# Patient Record
Sex: Male | Born: 1980 | Race: Asian | Hispanic: No | Marital: Married | Smoking: Former smoker
Health system: Southern US, Community
[De-identification: ages and names within clinical notes are randomized; demographics above are authoritative.]

## PROBLEM LIST (undated history)

## (undated) DIAGNOSIS — K219 Gastro-esophageal reflux disease without esophagitis: Secondary | ICD-10-CM

---

## 2007-12-29 ENCOUNTER — Emergency Department (HOSPITAL_COMMUNITY): Admission: EM | Admit: 2007-12-29 | Discharge: 2007-12-29 | Payer: Self-pay | Admitting: Emergency Medicine

## 2008-01-10 ENCOUNTER — Emergency Department (HOSPITAL_COMMUNITY): Admission: EM | Admit: 2008-01-10 | Discharge: 2008-01-10 | Payer: Self-pay | Admitting: Family Medicine

## 2008-01-25 ENCOUNTER — Ambulatory Visit: Payer: Self-pay | Admitting: Gastroenterology

## 2008-01-26 ENCOUNTER — Ambulatory Visit: Payer: Self-pay | Admitting: Gastroenterology

## 2008-01-26 ENCOUNTER — Encounter: Payer: Self-pay | Admitting: Gastroenterology

## 2008-02-03 ENCOUNTER — Ambulatory Visit (HOSPITAL_BASED_OUTPATIENT_CLINIC_OR_DEPARTMENT_OTHER): Admission: RE | Admit: 2008-02-03 | Discharge: 2008-02-03 | Payer: Self-pay | Admitting: Otolaryngology

## 2008-02-03 ENCOUNTER — Encounter (INDEPENDENT_AMBULATORY_CARE_PROVIDER_SITE_OTHER): Payer: Self-pay | Admitting: Otolaryngology

## 2008-02-06 ENCOUNTER — Emergency Department (HOSPITAL_COMMUNITY): Admission: EM | Admit: 2008-02-06 | Discharge: 2008-02-06 | Payer: Self-pay | Admitting: Emergency Medicine

## 2008-02-08 ENCOUNTER — Emergency Department (HOSPITAL_COMMUNITY): Admission: EM | Admit: 2008-02-08 | Discharge: 2008-02-08 | Payer: Self-pay | Admitting: Emergency Medicine

## 2008-02-25 ENCOUNTER — Emergency Department (HOSPITAL_COMMUNITY): Admission: EM | Admit: 2008-02-25 | Discharge: 2008-02-25 | Payer: Self-pay | Admitting: Emergency Medicine

## 2008-03-09 ENCOUNTER — Ambulatory Visit: Payer: Self-pay | Admitting: Gastroenterology

## 2008-03-15 ENCOUNTER — Encounter: Admission: RE | Admit: 2008-03-15 | Discharge: 2008-04-29 | Payer: Self-pay | Admitting: Family Medicine

## 2008-04-28 ENCOUNTER — Ambulatory Visit: Payer: Self-pay | Admitting: Internal Medicine

## 2008-04-28 DIAGNOSIS — IMO0001 Reserved for inherently not codable concepts without codable children: Secondary | ICD-10-CM | POA: Insufficient documentation

## 2008-04-28 DIAGNOSIS — J309 Allergic rhinitis, unspecified: Secondary | ICD-10-CM | POA: Insufficient documentation

## 2008-04-29 ENCOUNTER — Encounter (INDEPENDENT_AMBULATORY_CARE_PROVIDER_SITE_OTHER): Payer: Self-pay | Admitting: Internal Medicine

## 2008-05-03 DIAGNOSIS — E559 Vitamin D deficiency, unspecified: Secondary | ICD-10-CM | POA: Insufficient documentation

## 2008-05-05 ENCOUNTER — Encounter (INDEPENDENT_AMBULATORY_CARE_PROVIDER_SITE_OTHER): Payer: Self-pay | Admitting: Internal Medicine

## 2008-06-02 ENCOUNTER — Encounter (INDEPENDENT_AMBULATORY_CARE_PROVIDER_SITE_OTHER): Payer: Self-pay | Admitting: *Deleted

## 2008-06-03 ENCOUNTER — Encounter (INDEPENDENT_AMBULATORY_CARE_PROVIDER_SITE_OTHER): Payer: Self-pay | Admitting: Internal Medicine

## 2008-06-08 ENCOUNTER — Encounter (INDEPENDENT_AMBULATORY_CARE_PROVIDER_SITE_OTHER): Payer: Self-pay | Admitting: Internal Medicine

## 2008-06-13 ENCOUNTER — Encounter: Admission: RE | Admit: 2008-06-13 | Discharge: 2008-06-13 | Payer: Self-pay | Admitting: Family Medicine

## 2008-06-13 ENCOUNTER — Encounter (INDEPENDENT_AMBULATORY_CARE_PROVIDER_SITE_OTHER): Payer: Self-pay | Admitting: Internal Medicine

## 2008-07-27 ENCOUNTER — Ambulatory Visit: Payer: Self-pay | Admitting: Internal Medicine

## 2008-07-27 ENCOUNTER — Telehealth (INDEPENDENT_AMBULATORY_CARE_PROVIDER_SITE_OTHER): Payer: Self-pay | Admitting: Internal Medicine

## 2008-07-29 LAB — CONVERTED CEMR LAB
ALT: 48 units/L (ref 0–53)
BUN: 9 mg/dL (ref 6–23)
CO2: 22 meq/L (ref 19–32)
Calcium: 9.6 mg/dL (ref 8.4–10.5)
Chloride: 103 meq/L (ref 96–112)
Creatinine, Ser: 0.85 mg/dL (ref 0.40–1.50)
Glucose, Bld: 137 mg/dL — ABNORMAL HIGH (ref 70–99)
Total Bilirubin: 0.4 mg/dL (ref 0.3–1.2)
Vit D, 1,25-Dihydroxy: 28 — ABNORMAL LOW (ref 30–89)

## 2008-09-13 ENCOUNTER — Ambulatory Visit: Payer: Self-pay | Admitting: Internal Medicine

## 2008-09-13 DIAGNOSIS — Z8639 Personal history of other endocrine, nutritional and metabolic disease: Secondary | ICD-10-CM

## 2008-09-13 DIAGNOSIS — K219 Gastro-esophageal reflux disease without esophagitis: Secondary | ICD-10-CM

## 2008-09-13 DIAGNOSIS — Z862 Personal history of diseases of the blood and blood-forming organs and certain disorders involving the immune mechanism: Secondary | ICD-10-CM

## 2008-09-15 ENCOUNTER — Telehealth (INDEPENDENT_AMBULATORY_CARE_PROVIDER_SITE_OTHER): Payer: Self-pay | Admitting: Internal Medicine

## 2008-09-20 ENCOUNTER — Encounter (INDEPENDENT_AMBULATORY_CARE_PROVIDER_SITE_OTHER): Payer: Self-pay | Admitting: Internal Medicine

## 2008-09-22 ENCOUNTER — Encounter (INDEPENDENT_AMBULATORY_CARE_PROVIDER_SITE_OTHER): Payer: Self-pay | Admitting: Internal Medicine

## 2008-09-22 LAB — CONVERTED CEMR LAB
ALT: 44 units/L (ref 0–53)
AST: 31 units/L (ref 0–37)
Albumin: 4.9 g/dL (ref 3.5–5.2)
Alkaline Phosphatase: 68 units/L (ref 39–117)
HCV Ab: NEGATIVE
Hep A Total Ab: POSITIVE — AB
Hep B Core Total Ab: NEGATIVE
Hep B S Ab: POSITIVE — AB
Total Bilirubin: 0.4 mg/dL (ref 0.3–1.2)

## 2008-09-29 ENCOUNTER — Encounter (INDEPENDENT_AMBULATORY_CARE_PROVIDER_SITE_OTHER): Payer: Self-pay | Admitting: *Deleted

## 2008-09-29 ENCOUNTER — Telehealth (INDEPENDENT_AMBULATORY_CARE_PROVIDER_SITE_OTHER): Payer: Self-pay | Admitting: Internal Medicine

## 2008-10-07 ENCOUNTER — Ambulatory Visit: Payer: Self-pay | Admitting: Internal Medicine

## 2008-10-10 ENCOUNTER — Ambulatory Visit: Payer: Self-pay | Admitting: Nurse Practitioner

## 2008-10-10 ENCOUNTER — Telehealth (INDEPENDENT_AMBULATORY_CARE_PROVIDER_SITE_OTHER): Payer: Self-pay | Admitting: Nurse Practitioner

## 2008-10-10 DIAGNOSIS — H6690 Otitis media, unspecified, unspecified ear: Secondary | ICD-10-CM | POA: Insufficient documentation

## 2008-10-20 ENCOUNTER — Encounter (INDEPENDENT_AMBULATORY_CARE_PROVIDER_SITE_OTHER): Payer: Self-pay | Admitting: *Deleted

## 2008-10-20 LAB — CONVERTED CEMR LAB: Phosphorus: 3.7 mg/dL (ref 2.3–4.6)

## 2008-10-23 ENCOUNTER — Telehealth (INDEPENDENT_AMBULATORY_CARE_PROVIDER_SITE_OTHER): Payer: Self-pay | Admitting: Internal Medicine

## 2008-11-04 ENCOUNTER — Telehealth (INDEPENDENT_AMBULATORY_CARE_PROVIDER_SITE_OTHER): Payer: Self-pay | Admitting: Internal Medicine

## 2008-12-09 ENCOUNTER — Ambulatory Visit: Payer: Self-pay | Admitting: Internal Medicine

## 2008-12-09 DIAGNOSIS — H6980 Other specified disorders of Eustachian tube, unspecified ear: Secondary | ICD-10-CM | POA: Insufficient documentation

## 2009-06-20 IMAGING — CR DG CHEST 1V
1 series · 1 of 1 positions shown · non-contrast
Comparison: NONE

CLINICAL DATA: Positive PPD. 

CHEST - SINGLE VIEW (PA)

[view not recorded]
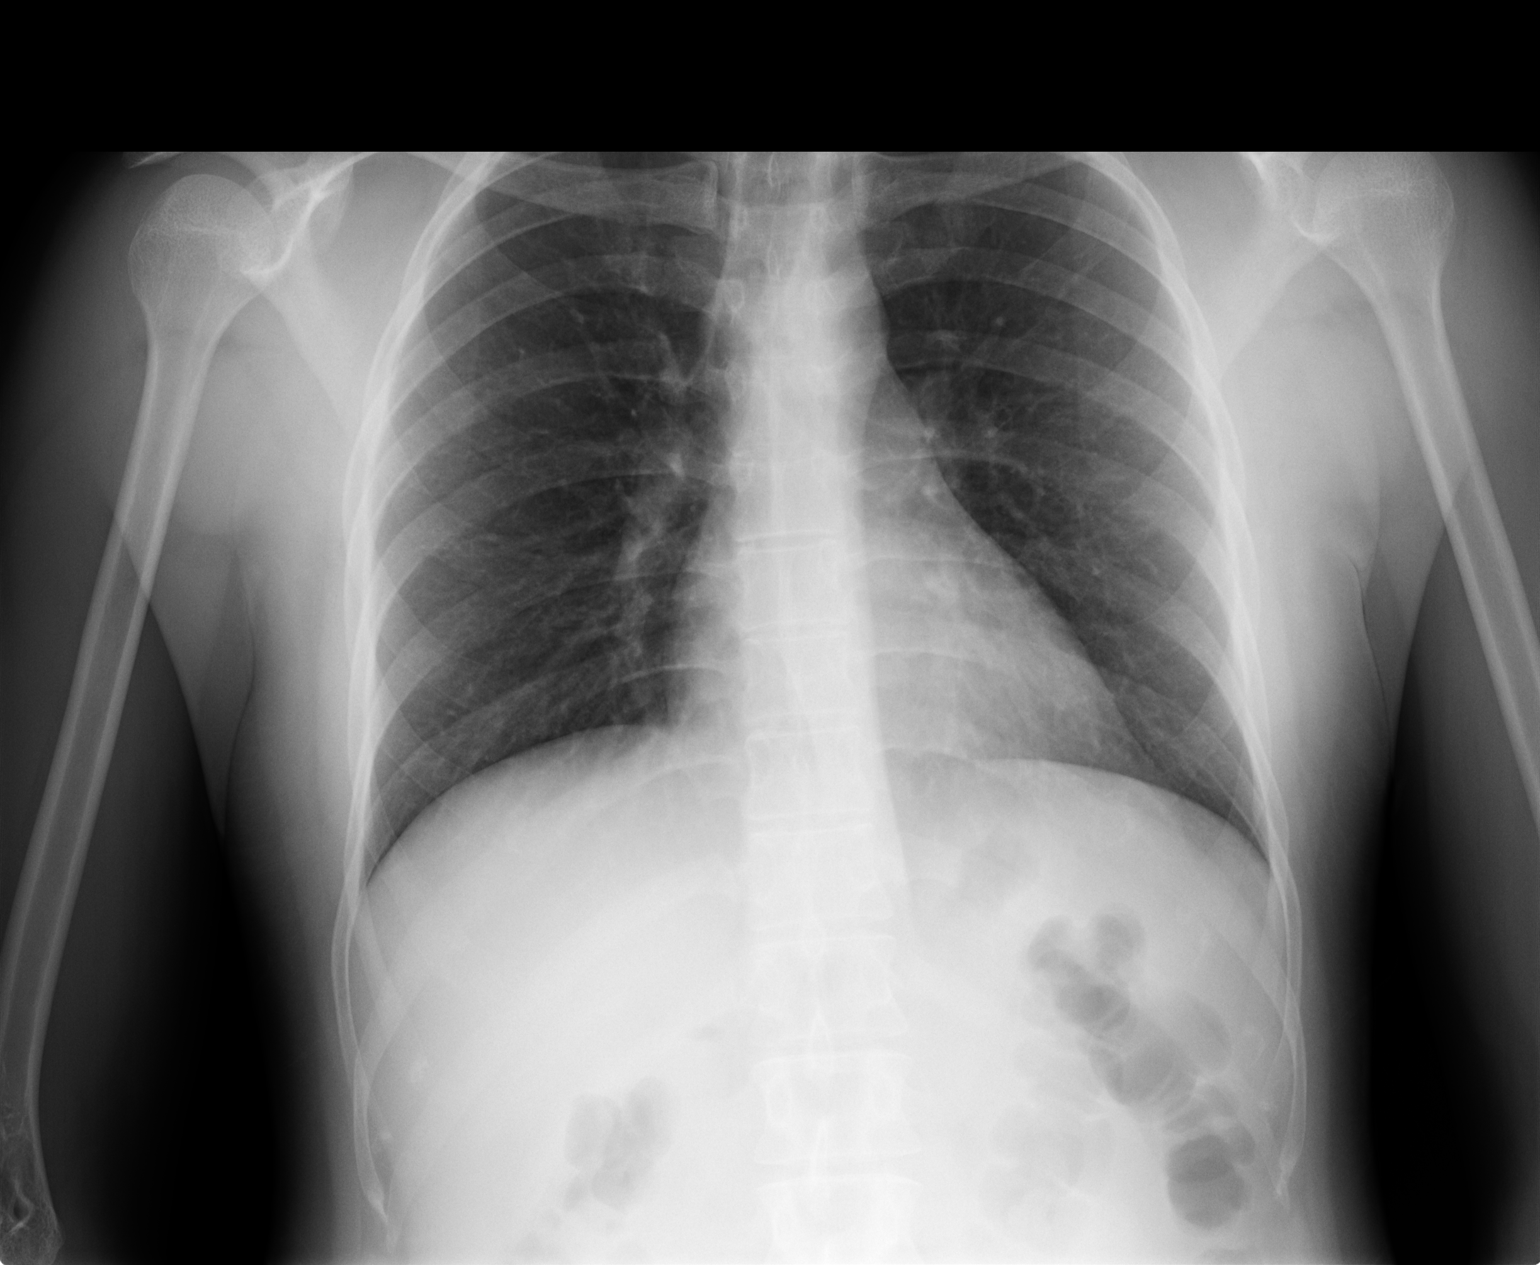

[1 of 1 positions shown; findings below may reference images not displayed]

FINDINGS: The lungs are clear and well expanded.   Heart and 
pulmonary vessels are normal.  No significant abnormalities are 
noted in the regional skeleton.  No evidence of thoracic aortic 
aneurysm or dissection.
IMPRESSION: No active disease. No evidence of active or remote 
05/10/2008 Dict Date: 05/10/2008  Trans Date: 05/10/2008 [REDACTED]  [REDACTED]

## 2011-05-14 NOTE — Op Note (Signed)
Erik Douglas, Erik Douglas                ACCOUNT NO.:  [id number]   MEDICAL RECORD NO.:  [id number]          PATIENT TYPE:  AMB   LOCATION:  DSC                          FACILITY:  [facility]   PHYSICIAN:  Karol T. Lazarus Salines, M.D. DATE OF BIRTH:  [birth year]   DATE OF PROCEDURE:  02/03/2008  DATE OF DISCHARGE:                               OPERATIVE REPORT   PREOPERATIVE DIAGNOSIS:  Recurrent tonsillitis.   POSTOPERATIVE DIAGNOSIS:  Recurrent tonsillitis.   PROCEDURE PERFORMED:  Tonsillectomy.   SURGEON:  Gloris Manchester. Wolicki, MD.   ANESTHESIA:  General orotracheal.   ESTIMATED BLOOD LOSS:  Minimal.   COMPLICATIONS:  None.   FINDINGS:  2+, embedded, fibrotic tonsils with copious, cryptic debris.  No residual adenoids.  Normal soft palate.   PROCEDURE:  With the patient in a comfortable, supine position, a  general orotracheal anesthesia was induced without difficulty.  At an  appropriate level, the table was turned 90 degrees and the patient  placed in Trendelenburg.  A clean preparation and draping was  accomplished.  Taking care to protect lips, teeth, and endotracheal  tube, the Crowe-Davis mouth gag was introduced, expanded for  visualization, and suspended from the Mayo stand in the standard  fashion.  The findings were as described above.  A palate retractor and  mirror were used to visualize the nasopharynx with the findings as  described above.  The anterior nose was examined with a speculum with no  significant findings.  One-half percent Xylocaine with 1:200,000  epinephrine, 8 ml total was infiltrated into the peritonsillar planes  for intraoperative hemostasis.  Several minutes were allowed for this to  take effect.   Beginning on the left side, the tonsil was grasped and retracted  medially.  The mucosa overlying the anterior and superior poles was  coagulated and then cut down to the capsule of the tonsil.  Using the  cautery tip as a blunt dissector, lysing fibrous bands,  and coagulating  crossing vessels as identified, the tonsil was removed from its muscular  fossa from inferiorly upward.  The tonsil was removed in its entirety as  determined by examination of both the tonsil and fossa.  A small,  additional quantity of cautery rendered the fossa hemostatic.  This  completed the left side.  The right side was done in an identical  fashion with slightly more fibrosis noted.  After completing the right  side and rendering both sides hemostatic, the mouth gag was relaxed for  several minutes.  Upon reexpansion, hemostasis was persistent.  At this  point, the procedure was completed.  The mouth gag was relaxed and  removed.  The dental status was intact.  The patient was returned to  Anesthesia, awakened, extubated, and transferred to recovery in stable  condition.   COMMENT:  A 30 year old, [place]alese, immigrant male has been having  multiple, recurrent episodes of tonsillitis, hence, the indication for  today's procedure.  Anticipated routine postoperative recovery with  attention to analgesia, antibiosis, hydration, and observation for  bleeding, emesis, or airway compromise.  Will observe him 23 hours  extended recovery,  home sooner if he is doing nicely.      Gloris Manchester. Lazarus Salines, M.D.  Electronically Signed     KTW/MEDQ  D:  02/03/2008  T:  02/03/2008  Job:  [id number]

## 2011-05-14 NOTE — Assessment & Plan Note (Signed)
Erik Douglas                         GASTROENTEROLOGY OFFICE NOTE   Erik Douglas, Erik Douglas                         MRN:          [id number]  DATE:01/25/2008                            DOB:          Oct 11, 1981    REASON FOR CONSULTATION:  Abdominal pain.   Erik Douglas is a 30 year old male referred for evaluation of above.  For  the past 3 years he has been complaining of burning upper abdominal  pain.  Pain is worsened on an empty stomach.  He also has post prandial  nausea and complains of pyrosis.  He took omeprazole for 2 weeks with  improvement of his pyrosis, though the pain continued.  He is on no  gastric irritants including nonsteroidals.  He denies dysphagia or  odynophagia.  He also has occasional early satiety.   PAST MEDICAL HISTORY:  Unremarkable.   FAMILY HISTORY:  Noncontributory.   He is on no medications and HAS NO ALLERGIES.   He very rarely smokes, he does not drink.  He is married and unemployed.   REVIEW OF SYSTEMS:  Positive for occasional joint pain.   PHYSICAL EXAMINATION:  Pulse 76, blood pressure 100/60, weight 130.  HEENT: EOMI.  PERRLA.  Sclerae are anicteric.  Conjunctivae are pink.  NECK:  Supple without thyromegaly, adenopathy or carotid bruits.  CHEST:  Clear to auscultation and percussion without adventitious  sounds.  CARDIAC:  Regular rhythm; normal S1 S2.  There are no murmurs, gallops  or rubs.  ABDOMEN:  Bowel sounds are normoactive.  Abdomen is soft, nontender and  nondistended.  There are no abdominal masses, tenderness, splenic  enlargement or hepatomegaly.  EXTREMITIES:  Full range of motion.  No cyanosis, clubbing or edema.  RECTAL:  Deferred.   IMPRESSION:  1. Nonspecific dyspepsia.  He recently immigrated from [place].  H.      pylori is a concern.  2. Gastroesophageal reflux disease.   RECOMMENDATION:  1. Upper endoscopy.  2. I would consider anticholinergics and PPI therapy pending results      of  the endoscopy.     Barbette Hair. Arlyce Dice, MD,FACG  Electronically Signed    Erik Douglas  DD: 01/25/2008  DT: 01/25/2008  Job #: [id number]   cc:   Erik Douglas, M.D.

## 2011-05-14 NOTE — Letter (Signed)
January 25, 2008    Despina Arias   RE:  MIQUAN, TANDON  MRN:  [id number]  /  DOB:  [birth year]   Dear Lattie Haw:   It is my pleasure to have treated you recently as a new patient in my  office.  I appreciate your confidence and the opportunity to participate  in your care.   Since I do have a busy inpatient endoscopy schedule and office schedule,  my office hours vary weekly.  I am, however, available for emergency  calls every day through my office.  If I cannot promptly meet an urgent  office appointment, another one of our gastroenterologists will be able  to assist you.   My well-trained staff are prepared to help you at all times.  For  emergencies after office hours, a physician from our gastroenterology  section is always available through my 24-hour answering service.   While you are under my care, I encourage discussion of your questions  and concerns, and I will be happy to return your calls as soon as I am  available.   Once again, I welcome you as a new patient and I look forward to a happy  and healthy relationship.    Sincerely,      Barbette Hair. Arlyce Dice, MD,FACG  Electronically Signed   RDK/MedQ  DD: 01/25/2008  DT: 01/25/2008  Job #: [id number]

## 2011-09-19 LAB — POCT RAPID STREP A: Streptococcus, Group A Screen (Direct): NEGATIVE

## 2011-09-20 LAB — I-STAT 8, (EC8 V) (CONVERTED LAB)
Acid-Base Excess: 5 — ABNORMAL HIGH
Chloride: 100
HCT: 51
Hemoglobin: 17.3 — ABNORMAL HIGH
Operator id: 265201
Potassium: 3.5

## 2011-09-20 LAB — POCT I-STAT CREATININE: Operator id: 265201

## 2011-09-20 LAB — DIFFERENTIAL
Basophils Absolute: 0
Lymphocytes Relative: 35
Lymphs Abs: 4.3 — ABNORMAL HIGH
Neutro Abs: 6.6

## 2011-09-20 LAB — CBC
Hemoglobin: 15.1
Platelets: 333
RDW: 12.6
WBC: 12.3 — ABNORMAL HIGH

## 2011-09-20 LAB — POCT HEMOGLOBIN-HEMACUE: Hemoglobin: 15.6

## 2011-10-04 LAB — DIFFERENTIAL
Basophils Relative: 0
Eosinophils Absolute: 0.3
Monocytes Relative: 8
Neutrophils Relative %: 58

## 2011-10-04 LAB — HEPATIC FUNCTION PANEL
Albumin: 4.6
Total Bilirubin: 0.7
Total Protein: 8

## 2011-10-04 LAB — CBC
MCHC: 34
MCV: 89
Platelets: 247
RDW: 13.3

## 2012-02-15 ENCOUNTER — Emergency Department (HOSPITAL_COMMUNITY): Payer: PRIVATE HEALTH INSURANCE

## 2012-02-15 ENCOUNTER — Encounter (HOSPITAL_COMMUNITY): Payer: Self-pay | Admitting: *Deleted

## 2012-02-15 ENCOUNTER — Emergency Department (HOSPITAL_COMMUNITY)
Admission: EM | Admit: 2012-02-15 | Discharge: 2012-02-15 | Disposition: A | Discharge status: Home / Self Care | Payer: PRIVATE HEALTH INSURANCE | Attending: Emergency Medicine | Admitting: Emergency Medicine

## 2012-02-15 DIAGNOSIS — R10814 Left lower quadrant abdominal tenderness: Secondary | ICD-10-CM | POA: Insufficient documentation

## 2012-02-15 DIAGNOSIS — F17200 Nicotine dependence, unspecified, uncomplicated: Secondary | ICD-10-CM | POA: Insufficient documentation

## 2012-02-15 DIAGNOSIS — R319 Hematuria, unspecified: Secondary | ICD-10-CM | POA: Insufficient documentation

## 2012-02-15 DIAGNOSIS — Z79899 Other long term (current) drug therapy: Secondary | ICD-10-CM | POA: Insufficient documentation

## 2012-02-15 DIAGNOSIS — R1032 Left lower quadrant pain: Secondary | ICD-10-CM

## 2012-02-15 DIAGNOSIS — R112 Nausea with vomiting, unspecified: Secondary | ICD-10-CM | POA: Insufficient documentation

## 2012-02-15 DIAGNOSIS — Z841 Family history of disorders of kidney and ureter: Secondary | ICD-10-CM | POA: Insufficient documentation

## 2012-02-15 LAB — CBC
HCT: 43.7 % (ref 39.0–52.0)
Platelets: 225 10*3/uL (ref 150–400)
RDW: 12.4 % (ref 11.5–15.5)
WBC: 7.7 10*3/uL (ref 4.0–10.5)

## 2012-02-15 LAB — DIFFERENTIAL
Basophils Absolute: 0.1 10*3/uL (ref 0.0–0.1)
Lymphocytes Relative: 23 % (ref 12–46)
Monocytes Absolute: 0.5 10*3/uL (ref 0.1–1.0)
Neutro Abs: 5.3 10*3/uL (ref 1.7–7.7)
Neutrophils Relative %: 69 % (ref 43–77)

## 2012-02-15 LAB — COMPREHENSIVE METABOLIC PANEL
ALT: 66 U/L — ABNORMAL HIGH (ref 0–53)
AST: 41 U/L — ABNORMAL HIGH (ref 0–37)
CO2: 25 mEq/L (ref 19–32)
Chloride: 100 mEq/L (ref 96–112)
GFR calc non Af Amer: >90 mL/min (ref >90)
Potassium: 3.7 mEq/L (ref 3.5–5.1)
Sodium: 136 mEq/L (ref 135–145)
Total Bilirubin: 0.5 mg/dL (ref 0.3–1.2)

## 2012-02-15 LAB — URINALYSIS, ROUTINE W REFLEX MICROSCOPIC
Glucose, UA: NEGATIVE mg/dL
Leukocytes, UA: NEGATIVE
Protein, ur: NEGATIVE mg/dL

## 2012-02-15 MED ORDER — SODIUM CHLORIDE 0.9 % IV BOLUS (SEPSIS)
1000.0000 mL | Freq: Once | INTRAVENOUS | Status: AC
  Administered 2012-02-15: 1000 mL via INTRAVENOUS

## 2012-02-15 MED ORDER — ONDANSETRON HCL 4 MG/2ML IJ SOLN
4.0000 mg | Freq: Once | INTRAMUSCULAR | Status: AC
  Administered 2012-02-15: 4 mg via INTRAVENOUS
  Filled 2012-02-15: qty 2

## 2012-02-15 MED ORDER — PROMETHAZINE HCL 25 MG PO TABS: 25.0000 mg | ORAL_TABLET | Freq: Four times a day (QID) | ORAL | Status: AC | PRN

## 2012-02-15 MED ORDER — MORPHINE SULFATE 4 MG/ML IJ SOLN
4.0000 mg | Freq: Once | INTRAMUSCULAR | Status: DC
  Filled 2012-02-15: qty 1

## 2012-02-15 MED ORDER — KETOROLAC TROMETHAMINE 30 MG/ML IJ SOLN
INTRAMUSCULAR | Status: AC
  Administered 2012-02-15: 30 mg
  Filled 2012-02-15: qty 1

## 2012-02-15 MED ORDER — TRAMADOL HCL 50 MG PO TABS: 50.0000 mg | ORAL_TABLET | Freq: Four times a day (QID) | ORAL | Status: AC | PRN

## 2012-02-15 NOTE — ED Notes (Signed)
Pt refused to have Ultrasound done. sts his pain is better and due to insurance reasons he does not want a lot of testing done. MD aware

## 2012-02-15 NOTE — Discharge Instructions (Signed)
You may have a kidney stone. Return if your pain worsens to the emergency department  Ureteral Colic Ureteral colic is spasm-like pain from the kidney or the ureter. This is often caused by a kidney stone. The pain is caused by the stone trying to get through the tubes that pass your pee. HOME CARE   Drink enough fluids to keep your pee (urine) clear or pale yellow.   Strain all your pee. A strainer will be provided. Keep anything caught in the strainer and bring it to your doctor. The stone causing the pain may be very small.   Only take medicine as told by your doctor.   Follow up with your doctor as told.  GET HELP RIGHT AWAY IF:   Pain is not controlled with medicine.   Pain continues or gets worse.   The pain changes and there is chest or belly (abdominal) pain.   You pass out (faint).   You cannot pee.   You keep throwing up (vomiting).   You have a temperature by mouth above 102 F (38.9 C), not controlled by medicine.  MAKE SURE YOU:   Understand these instructions.   Will watch this condition.   Will get help right away if you are not doing well or get worse.  Document Released: 06/03/2008 Document Revised: 08/28/2011 Document Reviewed: 06/03/2008 [facility] Patient Information 2012 [place], [place].

## 2012-02-15 NOTE — ED Notes (Signed)
Reports LLQ pain that started at 0530, and having a "burning pain to his privates." denies difficulty urinating.

## 2012-02-15 NOTE — ED Provider Notes (Signed)
History     CSN: [id number]  Arrival date & time 02/15/12  0750   First MD Initiated Contact with Patient 02/15/12 0815      Chief Complaint  Patient presents with  . Abdominal Pain    (Consider location/radiation/quality/duration/timing/severity/associated sxs/prior treatment) HPI Pt p/w LLQ pain starting acutely at 0530 today radiating to groin. No urinary changes. + nausea and 1 episode of vomiting. No fever, chills, testicular pain, penile d/c. History of renal stones in family.  History reviewed. No pertinent past medical history.  History reviewed. No pertinent past surgical history.  History reviewed. No pertinent family history.  History  Substance Use Topics  . Smoking status: Current Everyday Smoker    Types: Cigarettes  . Smokeless tobacco: Not on file  . Alcohol Use: No      Review of Systems  Constitutional: Negative for fever and chills.  Respiratory: Negative for shortness of breath.   Cardiovascular: Negative for chest pain.  Gastrointestinal: Positive for nausea, vomiting and abdominal pain. Negative for diarrhea and constipation.  Genitourinary: Negative for dysuria, frequency, hematuria, flank pain, decreased urine volume, discharge, penile swelling, scrotal swelling, penile pain and testicular pain.  Musculoskeletal: Negative for back pain.  Skin: Negative for color change, pallor, rash and wound.    Allergies  Review of patient's allergies indicates no known allergies.  Home Medications   Current Outpatient Rx  Name Route Sig Dispense Refill  . RANITIDINE HCL 75 MG PO TABS Oral Take 75 mg by mouth daily as needed. Heartburn    . PROMETHAZINE HCL 25 MG PO TABS Oral Take 1 tablet (25 mg total) by mouth every 6 (six) hours as needed for nausea. 30 tablet 0  . TRAMADOL HCL 50 MG PO TABS Oral Take 1 tablet (50 mg total) by mouth every 6 (six) hours as needed for pain. 15 tablet 0    BP 131/92  Pulse 69  Temp(Src) 97.6 F (36.4 C) (Oral)  Resp  16  SpO2 100%  Physical Exam  Nursing note and vitals reviewed. Constitutional: He is oriented to person, place, and time. He appears well-developed and well-nourished. He appears distressed (mild distress, walking around room).  HENT:  Head: Normocephalic and atraumatic.  Mouth/Throat: Oropharynx is clear and moist.  Eyes: EOM are normal. Pupils are equal, round, and reactive to light.  Neck: Normal range of motion. Neck supple.  Cardiovascular: Normal rate and regular rhythm.   Pulmonary/Chest: Effort normal and breath sounds normal. No respiratory distress. He has no wheezes. He has no rales.  Abdominal: Soft. Bowel sounds are normal. There is tenderness. Hernia confirmed negative in the right inguinal area and confirmed negative in the left inguinal area.       Mild LLQ tenderness to palp  Genitourinary: Penis normal. Right testis shows no mass and no tenderness. Cremasteric reflex is not absent on the right side. Left testis shows no mass and no tenderness. Cremasteric reflex is not absent on the left side. No discharge found.  Musculoskeletal: Normal range of motion. He exhibits no edema and no tenderness.  Neurological: He is alert and oriented to person, place, and time.  Skin: Skin is warm and dry. No rash noted. No erythema.  Psychiatric: He has a normal mood and affect. His behavior is normal.    ED Course  Procedures (including critical care time)  Labs Reviewed  URINALYSIS, ROUTINE W REFLEX MICROSCOPIC - Abnormal; Notable for the following:    Hgb urine dipstick LARGE (*)  All other components within normal limits  COMPREHENSIVE METABOLIC PANEL - Abnormal; Notable for the following:    Glucose, Bld 104 (*)    AST 41 (*)    ALT 66 (*)    All other components within normal limits  CBC  DIFFERENTIAL  URINE MICROSCOPIC-ADD ON   No results found.   1. Hematuria   2. Abdominal pain, LLQ (left lower quadrant)   3. Family history of renal stone       MDM  Pt  states pain has improved dramatically and is refusing all imaging tests. He understands the risks involved and is competent to make decisions regarding his care. He was informed of the results and that he could be passing a renal stone. If his symptoms worsen he should present immediately to the ED for further evaluation        Loren Racer, MD 02/15/12 1026

## 2012-02-16 ENCOUNTER — Encounter (HOSPITAL_COMMUNITY): Payer: Self-pay | Admitting: *Deleted

## 2012-02-16 ENCOUNTER — Emergency Department (INDEPENDENT_AMBULATORY_CARE_PROVIDER_SITE_OTHER)
Admission: EM | Admit: 2012-02-16 | Discharge: 2012-02-16 | Disposition: A | Discharge status: Home / Self Care | Payer: PRIVATE HEALTH INSURANCE | Source: Home / Self Care

## 2012-02-16 DIAGNOSIS — R109 Unspecified abdominal pain: Secondary | ICD-10-CM

## 2012-02-16 DIAGNOSIS — R319 Hematuria, unspecified: Secondary | ICD-10-CM

## 2012-02-16 LAB — POCT URINALYSIS DIP (DEVICE)
Protein, ur: NEGATIVE mg/dL
Specific Gravity, Urine: <=1.005 (ref 1.005–1.030)
Urobilinogen, UA: 0.2 mg/dL (ref 0.0–1.0)
pH: 6 (ref 5.0–8.0)

## 2012-02-16 NOTE — ED Provider Notes (Signed)
History     CSN: [id number]  Arrival date & time 02/16/12  1323   None     Chief Complaint  Patient presents with  . Flank Pain    (Consider location/radiation/quality/duration/timing/severity/associated sxs/prior treatment) HPI Comments: Pt seen yesterday in ED for same, told might have kidney stone but declined imaging due to cost. Here because wants to know how big stone is.  Has same sx as yesterday, but as long as takes meds rx yesterday sx are well controlled.   Patient is a 31 y.o. male presenting with flank pain. The history is provided by the patient.  Flank Pain This is a new problem. The current episode started yesterday. The problem occurs constantly. The problem has not changed since onset.Associated symptoms include abdominal pain. Pertinent negatives include no chest pain. Associated symptoms comments: N/v. The symptoms are relieved by medications. The treatment provided moderate relief.    History reviewed. No pertinent past medical history.  History reviewed. No pertinent past surgical history.  History reviewed. No pertinent family history.  History  Substance Use Topics  . Smoking status: Current Everyday Smoker    Types: Cigarettes  . Smokeless tobacco: Not on file  . Alcohol Use: No      Review of Systems  Constitutional: Negative for fever and chills.  Cardiovascular: Negative for chest pain.  Gastrointestinal: Positive for nausea and abdominal pain. Negative for vomiting.  Genitourinary: Positive for flank pain. Negative for hematuria.       Burning sensation in genital area    Allergies  Review of patient's allergies indicates no known allergies.  Home Medications   Current Outpatient Rx  Name Route Sig Dispense Refill  . PROMETHAZINE HCL 25 MG PO TABS Oral Take 1 tablet (25 mg total) by mouth every 6 (six) hours as needed for nausea. 30 tablet 0  . RANITIDINE HCL 75 MG PO TABS Oral Take 75 mg by mouth daily as needed. Heartburn    .  TRAMADOL HCL 50 MG PO TABS Oral Take 1 tablet (50 mg total) by mouth every 6 (six) hours as needed for pain. 15 tablet 0    BP 112/80  Pulse 87  Temp(Src) 98.1 F (36.7 C) (Oral)  Resp 16  SpO2 99%  Physical Exam  Constitutional: He appears well-developed and well-nourished. No distress.  Cardiovascular: Normal rate and regular rhythm.   Pulmonary/Chest: Effort normal and breath sounds normal.  Abdominal: Soft. Bowel sounds are normal. There is tenderness in the left lower quadrant. There is CVA tenderness.       cvat L; abd tenderness is L middle abd  Genitourinary:       Reviewed exam notes from yesterday's ER visit; no concerning findings; as no change in sx, exam deferred today  Skin: Skin is warm and dry. No rash noted.    ED Course  Procedures (including critical care time)  Labs Reviewed  POCT URINALYSIS DIP (DEVICE) - Abnormal; Notable for the following:    Hgb urine dipstick MODERATE (*)    All other components within normal limits   No results found.   1. Abdominal pain   2. Hematuria     Discussed with pt his options. Pt elects to f/u with urology for further testing and management.   MDM          Cathlyn Parsons, NP 02/16/12 1544

## 2012-02-16 NOTE — ED Provider Notes (Signed)
Medical screening examination/treatment/procedure(s) were performed by non-physician practitioner and as supervising physician I was immediately available for consultation/collaboration.  Mark Hassey   Jahlisa Rossitto, MD 02/16/12 1547 

## 2012-02-16 NOTE — Discharge Instructions (Signed)
Call [facility] tomorrow morning, explain you were seen here and at the ER and that you might have a kidney stone, but you definitely have blood in your urine.  Arrange for a follow up appointment.  They will be able to order any other tests that you need for this. Continue to use your nausea and pain medicine as needed.  If your symptoms become much worse and you cannot manage them with the medicine, or if you develop a very high fever, please go to the ER.

## 2012-02-16 NOTE — ED Notes (Signed)
Pt seen emergency room yesterday for flank pain probable kidney stone - pt refused ultrasound at that time - presents to [facility] pt taking pain medication as directed pain improves with medication but pain returns  - pt comfortable at this time -

## 2012-06-17 ENCOUNTER — Other Ambulatory Visit: Payer: Self-pay | Admitting: Urology

## 2012-06-22 ENCOUNTER — Encounter (HOSPITAL_COMMUNITY): Payer: Self-pay | Admitting: Pharmacy Technician

## 2012-06-24 MED ORDER — CIPROFLOXACIN HCL 500 MG PO TABS: 500.0000 mg | ORAL_TABLET | ORAL | Status: DC

## 2012-06-24 NOTE — Progress Notes (Signed)
06/24/12 Erik Douglas called from [facility] and was unable to discontinue cipro 500mg  po in preop orders.  She asked if nurse could do so.  Cipro 500mg  po discontinued from preop orders.

## 2012-06-26 ENCOUNTER — Encounter (HOSPITAL_COMMUNITY)
Admission: RE | Admit: 2012-06-26 | Discharge: 2012-06-26 | Disposition: A | Discharge status: Home / Self Care | Payer: PRIVATE HEALTH INSURANCE | Source: Ambulatory Visit | Attending: Urology | Admitting: Urology

## 2012-06-26 ENCOUNTER — Encounter (HOSPITAL_COMMUNITY): Payer: Self-pay

## 2012-06-26 DIAGNOSIS — K219 Gastro-esophageal reflux disease without esophagitis: Secondary | ICD-10-CM

## 2012-06-26 HISTORY — PX: TONSILLECTOMY: SUR1361

## 2012-06-26 HISTORY — DX: Gastro-esophageal reflux disease without esophagitis: K21.9

## 2012-06-26 LAB — SURGICAL PCR SCREEN: MRSA, PCR: INVALID — AB

## 2012-06-26 NOTE — Patient Instructions (Addendum)
20 Berdell Hostetler  06/26/2012   Your procedure is scheduled on:   07-06-2012  Report to [facility] at     6:30   AM .  Call this number if you have problems the morning of surgery: [phone]   Remember:   Do not eat food:After Midnight.  Follow any laxative prep instructions per [facility] instructions. Complete all forms in blue folder and sign in disignated areas. Complete Health history form for [facility]. Avoid medications on Discontinue use med list given.   Take these medicines the morning of surgery with A SIP OF WATER:    Do not wear jewelry, make-up or nail polish.  Do not wear lotions, powders, or perfumes. You may wear deodorant.  Do not shave 48 hours prior to surgery.(face and neck okay, no shaving of legs)  Do not bring valuables to the hospital.  Contacts, dentures or bridgework may not be worn into surgery.  Leave suitcase in the car. After surgery it may be brought to your room.  For patients admitted to the hospital, checkout time is 11:00 AM the day of discharge.   Patients discharged the day of surgery will not be allowed to drive home.  Name and phone number of your driver: will arrange  Special Instructions: CHG Shower Use Special Wash: 1/2 bottle night before surgery and 1/2 bottle morning of surgery.(avoid face and genitals)   Please read over the following fact sheets that you were given: MRSA Information.

## 2012-06-29 LAB — MRSA CULTURE

## 2012-07-06 ENCOUNTER — Ambulatory Visit (HOSPITAL_COMMUNITY): Payer: PRIVATE HEALTH INSURANCE

## 2012-07-06 ENCOUNTER — Encounter (HOSPITAL_COMMUNITY): Payer: Self-pay | Admitting: *Deleted

## 2012-07-06 ENCOUNTER — Ambulatory Visit (HOSPITAL_COMMUNITY): Payer: PRIVATE HEALTH INSURANCE | Admitting: Anesthesiology

## 2012-07-06 ENCOUNTER — Encounter (HOSPITAL_COMMUNITY)
Admission: RE | Disposition: A | Discharge status: Home / Self Care | Payer: Self-pay | Source: Ambulatory Visit | Attending: Urology

## 2012-07-06 ENCOUNTER — Encounter (HOSPITAL_COMMUNITY): Payer: Self-pay | Admitting: Anesthesiology

## 2012-07-06 ENCOUNTER — Ambulatory Visit (HOSPITAL_COMMUNITY)
Admission: RE | Admit: 2012-07-06 | Discharge: 2012-07-06 | Disposition: A | Discharge status: Home / Self Care | Payer: PRIVATE HEALTH INSURANCE | Source: Ambulatory Visit | Attending: Urology | Admitting: Urology

## 2012-07-06 DIAGNOSIS — K219 Gastro-esophageal reflux disease without esophagitis: Secondary | ICD-10-CM | POA: Insufficient documentation

## 2012-07-06 DIAGNOSIS — N200 Calculus of kidney: Secondary | ICD-10-CM | POA: Insufficient documentation

## 2012-07-06 DIAGNOSIS — Z79899 Other long term (current) drug therapy: Secondary | ICD-10-CM | POA: Insufficient documentation

## 2012-07-06 DIAGNOSIS — Z01812 Encounter for preprocedural laboratory examination: Secondary | ICD-10-CM | POA: Insufficient documentation

## 2012-07-06 HISTORY — PX: CYSTOSCOPY W/ RETROGRADES: SHX1426

## 2012-07-06 SURGERY — CYSTOSCOPY, WITH RETROGRADE PYELOGRAM
Anesthesia: General | Laterality: Right | Wound class: Clean Contaminated

## 2012-07-06 SURGERY — LITHOTRIPSY, ESWL
Anesthesia: LOCAL | Laterality: Right

## 2012-07-06 MED ORDER — HYDROMORPHONE HCL PF 1 MG/ML IJ SOLN
0.2500 mg | INTRAMUSCULAR | Status: DC | PRN
  Administered 2012-07-06 (×3): 0.25 mg via INTRAVENOUS

## 2012-07-06 MED ORDER — ONDANSETRON HCL 4 MG/2ML IJ SOLN
INTRAMUSCULAR | Status: DC | PRN
  Administered 2012-07-06: 4 mg via INTRAVENOUS

## 2012-07-06 MED ORDER — KETAMINE HCL 10 MG/ML IJ SOLN
INTRAMUSCULAR | Status: DC | PRN
  Administered 2012-07-06: 25 mg via INTRAVENOUS

## 2012-07-06 MED ORDER — LIDOCAINE HCL (CARDIAC) 20 MG/ML IV SOLN
INTRAVENOUS | Status: DC | PRN
  Administered 2012-07-06: 50 mg via INTRAVENOUS

## 2012-07-06 MED ORDER — LIDOCAINE HCL 2 % EX GEL
CUTANEOUS | Status: DC | PRN
  Administered 2012-07-06: 1 via URETHRAL

## 2012-07-06 MED ORDER — DEXTROSE-NACL 5-0.45 % IV SOLN: INTRAVENOUS | Status: DC

## 2012-07-06 MED ORDER — BELLADONNA ALKALOIDS-OPIUM 16.2-60 MG RE SUPP
RECTAL | Status: AC
  Filled 2012-07-06: qty 1

## 2012-07-06 MED ORDER — CEFAZOLIN SODIUM-DEXTROSE 2-3 GM-% IV SOLR
INTRAVENOUS | Status: AC
  Filled 2012-07-06: qty 50

## 2012-07-06 MED ORDER — MIDAZOLAM HCL 5 MG/5ML IJ SOLN
INTRAMUSCULAR | Status: DC | PRN
  Administered 2012-07-06: 2 mg via INTRAVENOUS

## 2012-07-06 MED ORDER — DIAZEPAM 5 MG PO TABS: 10.0000 mg | ORAL_TABLET | ORAL | Status: DC

## 2012-07-06 MED ORDER — METOCLOPRAMIDE HCL 5 MG/ML IJ SOLN
INTRAMUSCULAR | Status: DC | PRN
  Administered 2012-07-06 (×2): 5 mg via INTRAVENOUS

## 2012-07-06 MED ORDER — BELLADONNA ALKALOIDS-OPIUM 16.2-60 MG RE SUPP
RECTAL | Status: DC | PRN
  Administered 2012-07-06: 1 via RECTAL

## 2012-07-06 MED ORDER — CEFAZOLIN SODIUM-DEXTROSE 2-3 GM-% IV SOLR
2.0000 g | INTRAVENOUS | Status: AC
  Administered 2012-07-06: 2 g via INTRAVENOUS

## 2012-07-06 MED ORDER — IOHEXOL 300 MG/ML  SOLN
INTRAMUSCULAR | Status: AC
  Filled 2012-07-06: qty 1

## 2012-07-06 MED ORDER — ACETAMINOPHEN 10 MG/ML IV SOLN
1000.0000 mg | Freq: Once | INTRAVENOUS | Status: DC
  Filled 2012-07-06: qty 100

## 2012-07-06 MED ORDER — LACTATED RINGERS IV SOLN
INTRAVENOUS | Status: DC
  Administered 2012-07-06: 10:00:00 via INTRAVENOUS
  Administered 2012-07-06: 1000 mL via INTRAVENOUS

## 2012-07-06 MED ORDER — DEXAMETHASONE SODIUM PHOSPHATE 10 MG/ML IJ SOLN
INTRAMUSCULAR | Status: DC | PRN
  Administered 2012-07-06: 10 mg via INTRAVENOUS

## 2012-07-06 MED ORDER — GLYCOPYRROLATE 0.2 MG/ML IJ SOLN
INTRAMUSCULAR | Status: DC | PRN
  Administered 2012-07-06: .025 mg via INTRAVENOUS

## 2012-07-06 MED ORDER — FENTANYL CITRATE 0.05 MG/ML IJ SOLN
INTRAMUSCULAR | Status: DC | PRN
  Administered 2012-07-06 (×3): 50 ug via INTRAVENOUS

## 2012-07-06 MED ORDER — DIPHENHYDRAMINE HCL 25 MG PO CAPS: 25.0000 mg | ORAL_CAPSULE | ORAL | Status: DC

## 2012-07-06 MED ORDER — SODIUM CHLORIDE 0.9 % IR SOLN
Status: DC | PRN
  Administered 2012-07-06: 3000 mL via INTRAVESICAL

## 2012-07-06 MED ORDER — IOHEXOL 300 MG/ML  SOLN
INTRAMUSCULAR | Status: DC | PRN
  Administered 2012-07-06: 10 mL

## 2012-07-06 MED ORDER — LIDOCAINE HCL 2 % EX GEL
CUTANEOUS | Status: AC
  Filled 2012-07-06: qty 10

## 2012-07-06 MED ORDER — PROPOFOL 10 MG/ML IV BOLUS
INTRAVENOUS | Status: DC | PRN
  Administered 2012-07-06: 150 mg via INTRAVENOUS

## 2012-07-06 MED ORDER — TAPENTADOL HCL 100 MG PO TABS: 100.0000 mg | ORAL_TABLET | Freq: Four times a day (QID) | ORAL | Status: AC | PRN

## 2012-07-06 MED ORDER — HYDROMORPHONE HCL PF 1 MG/ML IJ SOLN
INTRAMUSCULAR | Status: AC
  Filled 2012-07-06: qty 1

## 2012-07-06 SURGICAL SUPPLY — 15 items
ADAPTER CATH URET PLST 4-6FR (CATHETERS) ×3 IMPLANT
BAG URO CATCHER STRL LF (DRAPE) ×3 IMPLANT
CATH INTERMIT  6FR 70CM (CATHETERS) ×3 IMPLANT
CLOTH BEACON ORANGE TIMEOUT ST (SAFETY) ×3 IMPLANT
DRAPE CAMERA CLOSED 9X96 (DRAPES) ×3 IMPLANT
GLOVE BIOGEL M STRL SZ7.5 (GLOVE) ×3 IMPLANT
GOWN STRL NON-REIN LRG LVL3 (GOWN DISPOSABLE) ×3 IMPLANT
GOWN STRL REIN XL XLG (GOWN DISPOSABLE) ×3 IMPLANT
GUIDEWIRE STR DUAL SENSOR (WIRE) ×3 IMPLANT
MANIFOLD NEPTUNE II (INSTRUMENTS) ×3 IMPLANT
NS IRRIG 1000ML POUR BTL (IV SOLUTION) ×3 IMPLANT
PACK CYSTO (CUSTOM PROCEDURE TRAY) ×3 IMPLANT
SCRUB PCMX 4 OZ (MISCELLANEOUS) ×3 IMPLANT
STENT CONTOUR 6FRX24X.038 (STENTS) ×3 IMPLANT
TUBING CONNECTING 10 (TUBING) ×3 IMPLANT

## 2012-07-06 NOTE — Anesthesia Postprocedure Evaluation (Signed)
  Anesthesia Post-op Note  Patient: Erik Douglas  Procedure(s) Performed: Procedure(s) (LRB): CYSTOSCOPY WITH RETROGRADE PYELOGRAM (Bilateral) CYSTOSCOPY WITH STENT PLACEMENT (Right)  Patient Location: PACU  Anesthesia Type: General  Level of Consciousness: oriented and sedated  Airway and Oxygen Therapy: Patient Spontanous Breathing  Post-op Pain: mild  Post-op Assessment: Post-op Vital signs reviewed, Patient's Cardiovascular Status Stable, Respiratory Function Stable and Patent Airway  Post-op Vital Signs: stable  Complications: No apparent anesthesia complications

## 2012-07-06 NOTE — Op Note (Signed)
Pre-operative diagnosis : Right lower pole stone , and recently passed left ureteral stone  Postoperative diagnosis: Same  Operation: Cystourethroscopy, bilateral retrograde pyelogram with interpretation, right double-J stent (6 [place] by 24 cm).  Surgeon:  Kathie Rhodes. Patsi Sears, MD  First assistant: None  Anesthesia:  general  Preparation: After appropriate preanesthesia, the patient was brought to the operating room, and placed upon the operating table in the dorsal supine position where general LMA anesthesia was introduced. The arm band was identified. The right arm was marked preoperatively.  Review history: The patient is a 31 year old male, seen in the emergency room in February of 2013 4 left flank pain, found to have a 6 mm left lower ureteral stone, which she has spontaneously passed. He was also found to have a large 1.2 cm right lower pole stone. He is now for lithotripsy of the right lower pole stone. Note the patient has a significant family history of kidney stones) brother). Because of the patient's poor insurance coverage, he has declined any other followup x-rays, except for KUB in the office.  Statement of  Likelihood of Success: Excellent. TIME-OUT observed.:  Procedure: Cystourethroscopy was accomplished, and left retrograde pyelogram is accomplished with interpretation. The penile urethral meatus is normal. The pendulous urethra is normal. The proximal urethra is normal. The ejaculatory ducts, and the prostatic urethra are normal. The bladder neck is normal. The bladder base, and trigone are normal. The ureteral orifices are normal, and there is clear reflux from both cortices. Retrograde pyelogram a left shows normal caliber ureter. There is no stone identified in the ureter. There is no hydronephrosis. The calyces appeared normal. The renal pelvis appears normal.  On the right side, the ureteral orifice is normal. The ureter is normal caliber, and there is no hydronephrosis. There  is a very large right lower pole stone. This has been measured at 1.2 cm on CT scan. Guidewire is passed into the right upper pole, and double-J stent is passed into the right upper pole, and coiled into the bladder. The patient has a small right intrarenal pelvis. Xylocaine jelly was placed in the bladder, after the bladder drained of fluid. Because the patient is having lithotripsy, he was not given Tylenol, or Toradol. Note because the patient is slightly elevated liver functions, he was also not given IV Tylenol, or Toradol. He was given a B. and O. suppository, however. He was awakened, and taken to recovery room in good condition. The suture on the double-J stent was removed.

## 2012-07-06 NOTE — Preoperative (Signed)
Beta Blockers   Reason not to administer Beta Blockers:Not Applicable, not on home bb 

## 2012-07-06 NOTE — Anesthesia Procedure Notes (Addendum)
Procedure Name: LMA Insertion Date/Time: 07/06/2012 9:22 AM Performed by: Randon Goldsmith CATHERINE PAYNE Pre-anesthesia Checklist: Patient identified, Emergency Drugs available, Suction available and Patient being monitored Patient Re-evaluated:Patient Re-evaluated prior to inductionOxygen Delivery Method: Circle system utilized Preoxygenation: Pre-oxygenation with 100% oxygen Intubation Type: IV induction LMA: LMA with gastric port inserted LMA Size: 4.0 Number of attempts: 1 Placement Confirmation: positive ETCO2 and breath sounds checked- equal and bilateral Tube secured with: Tape Dental Injury: Teeth and Oropharynx as per pre-operative assessment  Comments: LMA placed by Dr. Shireen Quan

## 2012-07-06 NOTE — H&P (Signed)
Active Problems Problems  1. Nephrolithiasis Of The Right Kidney 592.0  History of Present Illness        31 YO male patient of Dr. Imelda Pillow seen today to discuss proceeding with surgical intervention of renal stone.      Seen in the ER on 02/16/12 for Lt flank pain & nausea.   Family hx significant with a brother that has had kidney stones.  Interval HX:  Today states he was able to pass distal left stone (did not bring w/him today) but is now ready to have large right renal calculus treated while out on school break. No flank pain or hematuria.   Past Medical History Problems  1. History of  Esophageal Reflux 530.81  Surgical History Problems  1. History of  Tonsillectomy  Current Meds 1. Meloxicam 15 MG Oral Tablet; Take 1 tablet daily; Therapy: 19Feb2013 to (Evaluate:21Mar2013)   Requested for: 19Feb2013; Last Rx:19Feb2013 2. Promethazine HCl 25 MG Oral Tablet; Therapy: (Recorded:19Feb2013) to 3. Tamsulosin HCl 0.4 MG Oral Capsule; Take 1 capsule by mouth every day; Therapy: 19Feb2013  to (Last Rx:19Feb2013)  Requested for: 19Feb2013 4. TraMADol HCl 50 MG Oral Tablet; Therapy: (Recorded:19Feb2013) to 5. Zyrtec TABS; Therapy: (Recorded:19Feb2013) to  Allergies Medication  1. No Known Drug Allergies  Family History Problems  1. Maternal history of  Diabetes Mellitus V18.0 2. Family history of  Family Health Status - Father's Age 75. Family history of  Family Health Status - Mother's Age 43. Family history of  Family Health Status Number Of Children 1 daughter 5. Maternal history of  Hypercholesterolemia 6. Fraternal history of  Nephrolithiasis  Social History Problems  1. Alcohol Use occasional 2. Caffeine Use occasional 3. Currently In School 4. Marital History - Currently Married 5. Smoking Cigarettes 305.1 1 cigarette per day  Review of Systems Genitourinary, constitutional, skin, eye, otolaryngeal, hematologic/lymphatic, cardiovascular, pulmonary,  endocrine, musculoskeletal, gastrointestinal, neurological and psychiatric system(s) were reviewed and pertinent findings if present are noted.    Vitals Vital Signs [Data Includes: Last 1 Day]  12Jun2013 09:44AM  Blood Pressure: 122 / 83 Temperature: 98.3 F Heart Rate: 82  Physical Exam Constitutional: Well nourished and well developed . No acute distress. The patient appears well hydrated.  ENT:. The ears and nose are normal in appearance.  Neck: The appearance of the neck is normal.  Pulmonary: No respiratory distress.  Cardiovascular: Heart rate and rhythm are normal.  Abdomen: The abdomen is flat. The abdomen is soft and nontender. No suprapubic tenderness. No CVA tenderness.  Skin: Normal skin turgor and normal skin color and pigmentation.  Neuro/Psych:. Mood and affect are appropriate.    Results/Data Urine [Data Includes: Last 1 Day]   12Jun2013  COLOR YELLOW   APPEARANCE CLEAR   SPECIFIC GRAVITY 1.025   pH 6.0   GLUCOSE NEG mg/dL  BILIRUBIN NEG   KETONE NEG mg/dL  BLOOD TRACE   PROTEIN NEG mg/dL  UROBILINOGEN 0.2 mg/dL  NITRITE NEG   LEUKOCYTE ESTERASE NEG   SQUAMOUS EPITHELIAL/HPF RARE   WBC NONE SEEN WBC/hpf  RBC 0-3 RBC/hpf  BACTERIA NONE SEEN   CRYSTALS NONE SEEN   CASTS NONE SEEN   Other MUCUS    The following images/tracing/specimen were independently visualized:  KUB: large stable right renal calculus. Distal left ureteral calculus no longer present.  The following clinical lab reports were reviewed:  UA.    Assessment Assessed  1. Nephrolithiasis Of The Right Kidney 592.0  Plan Distal Ureteral Stone On The Left (  592.1), Nephrolithiasis Of The Right Kidney (592.0)  1. KUB  Done: 12Jun2013 12:00AM Health Maintenance (V70.0)  2. UA With REFLEX  Done: 12Jun2013 09:26AM Nephrolithiasis Of The Right Kidney (592.0)  3. Follow-up MD/NP/PA Office  Follow-up  Requested for: 12Jun2013    Pt has now passed L lower ureteral stone. He will have bilateral  retrograde pyelograms to ensure patency of both ureters and then R JJ stent and lithotripsy of R renal stone. (1.4 x 1.0cm).

## 2012-07-06 NOTE — Transfer of Care (Signed)
Immediate Anesthesia Transfer of Care Note  Patient: Erik Douglas  Procedure(s) Performed: Procedure(s) (LRB): CYSTOSCOPY WITH RETROGRADE PYELOGRAM (Bilateral) CYSTOSCOPY WITH STENT PLACEMENT (Right)  Patient Location: PACU  Anesthesia Type: General  Level of Consciousness: awake, alert  and patient cooperative  Airway & Oxygen Therapy: Patient Spontanous Breathing and Patient connected to face mask oxygen  Post-op Assessment: Report given to PACU RN, Post -op Vital signs reviewed and stable and Patient moving all extremities  Post vital signs: Reviewed and stable  Complications: No apparent anesthesia complications

## 2012-07-06 NOTE — Anesthesia Preprocedure Evaluation (Signed)
Anesthesia Evaluation  Patient identified by MRN, date of birth, ID band Patient awake    Reviewed: Allergy & Precautions, H&P , NPO status , Patient's Chart, lab work & pertinent test results, reviewed documented beta blocker date and time   Airway Mallampati: II TM Distance: >3 FB Neck ROM: Full    Dental  (+) Teeth Intact and Dental Advisory Given   Pulmonary neg pulmonary ROS,  breath sounds clear to auscultation        Cardiovascular negative cardio ROS  Rhythm:Regular Rate:Normal     Neuro/Psych negative neurological ROS  negative psych ROS   GI/Hepatic negative GI ROS, Neg liver ROS,   Endo/Other  negative endocrine ROS  Renal/GU Kidney stone  negative genitourinary   Musculoskeletal negative musculoskeletal ROS (+)   Abdominal   Peds negative pediatric ROS (+)  Hematology negative hematology ROS (+)   Anesthesia Other Findings   Reproductive/Obstetrics negative OB ROS                           Anesthesia Physical Anesthesia Plan  ASA: I  Anesthesia Plan: General   Post-op Pain Management:    Induction: Intravenous  Airway Management Planned: LMA  Additional Equipment:   Intra-op Plan:   Post-operative Plan: Extubation in OR  Informed Consent: I have reviewed the patients History and Physical, chart, labs and discussed the procedure including the risks, benefits and alternatives for the proposed anesthesia with the patient or authorized representative who has indicated his/her understanding and acceptance.   Dental advisory given  Plan Discussed with: Surgeon and CRNA  Anesthesia Plan Comments:         Anesthesia Quick Evaluation

## 2012-07-06 NOTE — Progress Notes (Signed)
Pt arrives from PACU. To be transferred to lithotripsy truck for ESWL.

## 2012-07-06 NOTE — Interval H&P Note (Signed)
History and Physical Interval Note:  07/06/2012 9:05 AM  [name]  has presented today for surgery, with the diagnosis of Right Lower Pole Stone  The various methods of treatment have been discussed with the patient and family. After consideration of risks, benefits and other options for treatment, the patient has consented to  Procedure(s) (LRB): CYSTOSCOPY WITH RETROGRADE PYELOGRAM (Right) CYSTOSCOPY WITH STENT PLACEMENT (Right) as a surgical intervention .  The patient's history has been reviewed, patient examined, no change in status, stable for surgery.  Douglas have reviewed the patients' chart and labs.  Questions were answered to the patient's satisfaction.     Erik Douglas

## 2012-07-08 ENCOUNTER — Encounter (HOSPITAL_COMMUNITY): Payer: Self-pay | Admitting: Urology

## 2014-02-17 ENCOUNTER — Ambulatory Visit: Payer: PRIVATE HEALTH INSURANCE | Admitting: Internal Medicine

## 2014-03-11 ENCOUNTER — Ambulatory Visit: Payer: PRIVATE HEALTH INSURANCE

## 2014-04-27 ENCOUNTER — Ambulatory Visit: Payer: PRIVATE HEALTH INSURANCE | Attending: Internal Medicine

## 2014-05-04 ENCOUNTER — Ambulatory Visit: Payer: PRIVATE HEALTH INSURANCE

## 2014-05-10 ENCOUNTER — Emergency Department (INDEPENDENT_AMBULATORY_CARE_PROVIDER_SITE_OTHER)
Admission: EM | Admit: 2014-05-10 | Discharge: 2014-05-10 | Disposition: A | Discharge status: Home / Self Care | Payer: Self-pay | Source: Home / Self Care | Attending: Family Medicine | Admitting: Family Medicine

## 2014-05-10 ENCOUNTER — Encounter (HOSPITAL_COMMUNITY): Payer: Self-pay | Admitting: Emergency Medicine

## 2014-05-10 DIAGNOSIS — M26609 Unspecified temporomandibular joint disorder, unspecified side: Secondary | ICD-10-CM

## 2014-05-10 DIAGNOSIS — J309 Allergic rhinitis, unspecified: Secondary | ICD-10-CM

## 2014-05-10 DIAGNOSIS — J302 Other seasonal allergic rhinitis: Secondary | ICD-10-CM

## 2014-05-10 MED ORDER — DICLOFENAC POTASSIUM 50 MG PO TABS: 50.0000 mg | ORAL_TABLET | Freq: Three times a day (TID) | ORAL | Status: AC

## 2014-05-10 MED ORDER — TRIAMCINOLONE ACETONIDE 40 MG/ML IJ SUSP
40.0000 mg | Freq: Once | INTRAMUSCULAR | Status: AC
  Administered 2014-05-10: 40 mg via INTRAMUSCULAR

## 2014-05-10 MED ORDER — TRIAMCINOLONE ACETONIDE 40 MG/ML IJ SUSP
INTRAMUSCULAR | Status: AC
  Filled 2014-05-10: qty 1

## 2014-05-10 MED ORDER — METHYLPREDNISOLONE ACETATE 40 MG/ML IJ SUSP
80.0000 mg | Freq: Once | INTRAMUSCULAR | Status: AC
  Administered 2014-05-10: 80 mg via INTRAMUSCULAR

## 2014-05-10 MED ORDER — METHYLPREDNISOLONE ACETATE 80 MG/ML IJ SUSP
INTRAMUSCULAR | Status: AC
Start: 2014-05-10 — End: 2014-05-10
  Filled 2014-05-10: qty 1

## 2014-05-10 MED ORDER — CETIRIZINE HCL 10 MG PO TABS: 10.0000 mg | ORAL_TABLET | Freq: Every day | ORAL | Status: AC

## 2014-05-10 MED ORDER — FLUTICASONE PROPIONATE 50 MCG/ACT NA SUSP: 1.0000 | Freq: Two times a day (BID) | NASAL | Status: AC

## 2014-05-10 NOTE — ED Notes (Signed)
Pt  States  He  Has  Allergies       And     Has  Had  Congestion       And headache              Pt  Reports     Symptoms                  of  Burning  Feet  As  Well          And   Frequent  Urination  Pt  Reports  As  Well that  He  Wants   His  Glucose  And  Liver  Checked  As  Well

## 2014-05-10 NOTE — ED Provider Notes (Signed)
CSN: [id number]     Arrival date & time 05/10/14  [id number] History   First MD Initiated Contact with Patient 05/10/14 1050     Chief Complaint  Patient presents with  . Headache   (Consider location/radiation/quality/duration/timing/severity/associated sxs/prior Treatment) Patient is a 33 y.o. male presenting with headaches. The history is provided by the patient.  Headache Pain location:  L temporal and R temporal Quality:  Dull Radiates to:  Does not radiate Chronicity:  New Similar to prior headaches: yes   Context comment:  Allergies and also tmj problems. Associated symptoms: congestion, drainage and sinus pressure   Associated symptoms: no blurred vision and no fever     Past Medical History  Diagnosis Date  . GERD (gastroesophageal reflux disease) 06-26-12    occ. a problem tx. Ranitidine   Past Surgical History  Procedure Laterality Date  . Tonsillectomy  06-26-12    2009  . Cystoscopy w/ retrogrades  07/06/2012    Procedure: CYSTOSCOPY WITH RETROGRADE PYELOGRAM;  Surgeon: Kathi LudwigSigmund I Tannenbaum, MD;  Location: [facility];  Service: Urology;  Laterality: Bilateral;  45 mins requested for this case  C-ARM     History reviewed. No pertinent family history. History  Substance Use Topics  . Smoking status: Former Smoker -- 4 years    Types: Cigarettes  . Smokeless tobacco: Not on file     Comment: pt. stated he is smoking less, trying to quit  . Alcohol Use: 0.0 oz/week    1-2 Cans of beer per week     Comment: beer    Review of Systems  Constitutional: Negative.  Negative for fever.  HENT: Positive for congestion, postnasal drip, rhinorrhea and sinus pressure.   Eyes: Negative for blurred vision.  Neurological: Positive for headaches.    Allergies  Review of patient's allergies indicates no known allergies.  Home Medications   Prior to Admission medications   Medication Sig Start Date End Date Taking? Authorizing Provider  Tapentadol HCl (NUCYNTA) 100 MG TABS Take  1 tablet (100 mg total) by mouth 4 (four) times daily as needed. 07/06/12   Kathi LudwigSigmund I Tannenbaum, MD   BP 117/77  Pulse 65  Temp(Src) 98.6 F (37 C) (Oral)  Resp 16  SpO2 100% Physical Exam  Nursing note and vitals reviewed. Constitutional: He is oriented to person, place, and time. He appears well-developed and well-nourished.  HENT:  Right Ear: External ear normal.  Left Ear: External ear normal.  Nose: Mucosal edema and rhinorrhea present.  Mouth/Throat: Oropharynx is clear and moist.  Right tmj pain.  Eyes: Conjunctivae are normal. Pupils are equal, round, and reactive to light.  Neck: Normal range of motion. Neck supple.  Cardiovascular: Normal heart sounds.   Pulmonary/Chest: Effort normal and breath sounds normal.  Lymphadenopathy:    He has no cervical adenopathy.  Neurological: He is alert and oriented to person, place, and time. No cranial nerve deficit. Coordination normal.  Skin: Skin is warm and dry.    ED Course  Procedures (including critical care time) Labs Review Labs Reviewed - No data to display  Imaging Review No results found.   MDM  No diagnosis found.     Linna HoffJames D Kindl, MD 05/10/14 1150

## 2014-05-10 NOTE — Discharge Instructions (Signed)
See specialist and clinic doctors advised for further care of your health issues.

## 2014-05-17 ENCOUNTER — Ambulatory Visit: Payer: Self-pay | Attending: Internal Medicine | Admitting: Internal Medicine

## 2014-05-17 ENCOUNTER — Encounter: Payer: Self-pay | Admitting: Internal Medicine

## 2014-05-17 VITALS — BP 136/89 | HR 64 | Temp 98.9°F | Resp 16 | Ht 62.0 in | Wt 139.0 lb

## 2014-05-17 DIAGNOSIS — M26629 Arthralgia of temporomandibular joint, unspecified side: Secondary | ICD-10-CM | POA: Insufficient documentation

## 2014-05-17 DIAGNOSIS — K219 Gastro-esophageal reflux disease without esophagitis: Secondary | ICD-10-CM | POA: Insufficient documentation

## 2014-05-17 DIAGNOSIS — Z7689 Persons encountering health services in other specified circumstances: Secondary | ICD-10-CM

## 2014-05-17 DIAGNOSIS — Z87891 Personal history of nicotine dependence: Secondary | ICD-10-CM | POA: Insufficient documentation

## 2014-05-17 DIAGNOSIS — Z7189 Other specified counseling: Secondary | ICD-10-CM

## 2014-05-17 LAB — COMPLETE METABOLIC PANEL WITH GFR
ALBUMIN: 4.4 g/dL (ref 3.5–5.2)
ALT: 157 U/L — AB (ref 0–53)
AST: 49 U/L — ABNORMAL HIGH (ref 0–37)
Alkaline Phosphatase: 69 U/L (ref 39–117)
BILIRUBIN TOTAL: 0.6 mg/dL (ref 0.2–1.2)
BUN: 13 mg/dL (ref 6–23)
CALCIUM: 9.3 mg/dL (ref 8.4–10.5)
CHLORIDE: 99 meq/L (ref 96–112)
CO2: 27 meq/L (ref 19–32)
Creat: 0.79 mg/dL (ref 0.50–1.35)
GLUCOSE: 88 mg/dL (ref 70–99)
Potassium: 4.7 mEq/L (ref 3.5–5.3)
SODIUM: 136 meq/L (ref 135–145)
TOTAL PROTEIN: 8 g/dL (ref 6.0–8.3)

## 2014-05-17 LAB — CBC WITH DIFFERENTIAL/PLATELET
BASOS ABS: 0 10*3/uL (ref 0.0–0.1)
BASOS PCT: 0 % (ref 0–1)
EOS ABS: 0 10*3/uL (ref 0.0–0.7)
Eosinophils Relative: 0 % (ref 0–5)
HCT: 47 % (ref 39.0–52.0)
HEMOGLOBIN: 16.1 g/dL (ref 13.0–17.0)
Lymphocytes Relative: 25 % (ref 12–46)
Lymphs Abs: 3 10*3/uL (ref 0.7–4.0)
MCH: 29.8 pg (ref 26.0–34.0)
MCHC: 34.3 g/dL (ref 30.0–36.0)
MCV: 87 fL (ref 78.0–100.0)
MONOS PCT: 7 % (ref 3–12)
Monocytes Absolute: 0.8 10*3/uL (ref 0.1–1.0)
NEUTROS ABS: 8.1 10*3/uL — AB (ref 1.7–7.7)
NEUTROS PCT: 68 % (ref 43–77)
PLATELETS: 346 10*3/uL (ref 150–400)
RBC: 5.4 MIL/uL (ref 4.22–5.81)
RDW: 13.6 % (ref 11.5–15.5)
WBC: 11.9 10*3/uL — ABNORMAL HIGH (ref 4.0–10.5)

## 2014-05-17 LAB — LIPID PANEL
CHOLESTEROL: 252 mg/dL — AB (ref 0–200)
HDL: 46 mg/dL (ref >39)
LDL Cholesterol: 145 mg/dL — ABNORMAL HIGH (ref 0–99)
TRIGLYCERIDES: 303 mg/dL — AB (ref <150)
Total CHOL/HDL Ratio: 5.5 Ratio
VLDL: 61 mg/dL — AB (ref 0–40)

## 2014-05-17 NOTE — Progress Notes (Signed)
Patient here today to establish care. Patient complains about pain in the right knee at times. 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

## 2014-05-17 NOTE — Progress Notes (Signed)
Patient ID: Erik AriasDurga Finklea, male   DOB: 29-Jun-1981, 33 y.o.   MRN: [id number]   Erik Douglas, is a 33 y.o. male  [id number]  [id number]  DOB - 29-Jun-1981  CC:  Chief Complaint  Patient presents with  . Establish Care       HPI: Erik AriasDurga Boberg is a 33 y.o. male here today to establish medical care. His past medical history includes GERD and allergy. He is here today to have his blood draw, he has been told before that his cholesterol level is high but not on any medication. He also recently notes his jaw pain and thinks he might have a temporomandibular joint arthritis. No history of trauma. Patient is able to eat and drink without restriction. Patient does not smoke cigarette, drinks alcohol occasionally. Patient has No headache, No chest pain, No abdominal pain - No Nausea, No new weakness tingling or numbness, No Cough - SOB.  No Known Allergies Past Medical History  Diagnosis Date  . GERD (gastroesophageal reflux disease) 06-26-12    occ. a problem tx. Ranitidine   Current Outpatient Prescriptions on File Prior to Visit  Medication Sig Dispense Refill  . diclofenac (CATAFLAM) 50 MG tablet Take 1 tablet (50 mg total) by mouth 3 (three) times daily. For headache and tmj problems  15 tablet  1  . fluticasone (FLONASE) 50 MCG/ACT nasal spray Place 1 spray into both nostrils 2 (two) times daily.  1 g  2  . cetirizine (ZYRTEC) 10 MG tablet Take 1 tablet (10 mg total) by mouth daily. One tab daily for allergies  30 tablet  1  . Tapentadol HCl (NUCYNTA) 100 MG TABS Take 1 tablet (100 mg total) by mouth 4 (four) times daily as needed.  28 tablet  0   No current facility-administered medications on file prior to visit.   No family history on file. History   Social History  . Marital Status: Married    Spouse Name: N/A    Number of Children: N/A  . Years of Education: N/A   Occupational History  . Not on file.   Social History Main Topics  . Smoking status: Former Smoker -- 4  years    Types: Cigarettes  . Smokeless tobacco: Not on file     Comment: pt. stated he is smoking less, trying to quit  . Alcohol Use: 0.0 oz/week    1-2 Cans of beer per week     Comment: beer  . Drug Use: No  . Sexual Activity: Yes   Other Topics Concern  . Not on file   Social History Narrative  . No narrative on file    Review of Systems: Constitutional: Negative for fever, chills, diaphoresis, activity change, appetite change and fatigue. HENT: Negative for ear pain, nosebleeds, congestion, facial swelling, rhinorrhea, neck pain, neck stiffness and ear discharge.  Eyes: Negative for pain, discharge, redness, itching and visual disturbance. Respiratory: Negative for cough, choking, chest tightness, shortness of breath, wheezing and stridor.  Cardiovascular: Negative for chest pain, palpitations and leg swelling. Gastrointestinal: Negative for abdominal distention. Genitourinary: Negative for dysuria, urgency, frequency, hematuria, flank pain, decreased urine volume, difficulty urinating and dyspareunia.  Musculoskeletal: Negative for back pain, joint swelling, arthralgia and gait problem. Neurological: Negative for dizziness, tremors, seizures, syncope, facial asymmetry, speech difficulty, weakness, light-headedness, numbness and headaches.  Hematological: Negative for adenopathy. Does not bruise/bleed easily. Psychiatric/Behavioral: Negative for hallucinations, behavioral problems, confusion, dysphoric mood, decreased concentration and agitation.    Objective:   Filed  Vitals:   05/17/14 1018  BP: 136/89  Pulse: 64  Temp: 98.9 F (37.2 C)  Resp: 16    Physical Exam: Constitutional: Patient appears well-developed and well-nourished. No distress. HENT: Normocephalic, atraumatic, External right and left ear normal. Oropharynx is clear and moist.  Eyes: Conjunctivae and EOM are normal. PERRLA, no scleral icterus. Neck: Normal ROM. Neck supple. No JVD. No tracheal  deviation. No thyromegaly. CVS: RRR, S1/S2 +, no murmurs, no gallops, no carotid bruit.  Pulmonary: Effort and breath sounds normal, no stridor, rhonchi, wheezes, rales.  Abdominal: Soft. BS +, no distension, tenderness, rebound or guarding.  Musculoskeletal: Normal range of motion. No edema and no tenderness.  Lymphadenopathy: No lymphadenopathy noted, cervical, inguinal or axillary Neuro: Alert. Normal reflexes, muscle tone coordination. No cranial nerve deficit. Skin: Skin is warm and dry. No rash noted. Not diaphoretic. No erythema. No pallor. Psychiatric: Normal mood and affect. Behavior, judgment, thought content normal.  Lab Results  Component Value Date   WBC 7.7 02/15/2012   HGB 15.3 02/15/2012   HCT 43.7 02/15/2012   MCV 88.3 02/15/2012   PLT 225 02/15/2012   Lab Results  Component Value Date   CREATININE 0.81 02/15/2012   BUN 8 02/15/2012   NA 136 02/15/2012   K 3.7 02/15/2012   CL 100 02/15/2012   CO2 25 02/15/2012    No results found for this basename: HGBA1C   Lipid Panel  No results found for this basename: chol, trig, hdl, cholhdl, vldl, ldlcalc       Assessment and plan:   1. Encounter to establish care with new doctor  - Hemoglobin A1c; Standing - CBC; Standing - COMPLETE METABOLIC PANEL WITH GFR - Hemoglobin A1c - CBC - CBC with Differential - Lipid Panel  2. TMJ arthralgia Patient reassured Use of diclofenac 50 mg tablet by mouth twice a day Return to clinic if no improvement   Return in about 1 year (around 05/18/2015), or if symptoms worsen or fail to improve, for Annual Physical.  The patient was given clear instructions to go to ER or return to medical center if symptoms don't improve, worsen or new problems develop. The patient verbalized understanding. The patient was told to call to get lab results if they haven't heard anything in the next week.     This note has been created with Education officer, environmentalDragon speech recognition software and smart phrase technology.  Any transcriptional errors are unintentional.    Jeanann Lewandowskylugbemiga Berneda Piccininni, MD, MHA, Maxwell CaulFACP, FAAP [facility] [place], [place] [phone]   05/17/2014, 11:06 AM

## 2014-05-18 ENCOUNTER — Telehealth: Payer: Self-pay | Admitting: Emergency Medicine

## 2014-05-18 MED ORDER — SIMVASTATIN 20 MG PO TABS: 20.0000 mg | ORAL_TABLET | Freq: Every day | ORAL | Status: AC

## 2014-05-18 MED ORDER — GEMFIBROZIL 600 MG PO TABS: 600.0000 mg | ORAL_TABLET | Freq: Two times a day (BID) | ORAL | Status: AC

## 2014-05-18 NOTE — Telephone Encounter (Signed)
Left message for pt to call clinic with lab results

## 2014-05-18 NOTE — Telephone Encounter (Signed)
Message copied by Darlis LoanSMITH, JILL D on Wed May 18, 2014  3:07 PM ------      Message from: Jeanann LewandowskyJEGEDE, OLUGBEMIGA E      Created: Wed May 18, 2014  1:24 PM       Please inform patient that his liver enzymes are slightly elevated and his cholesterol levels are very high. We will need to start him on medication for high cholesterol. We also advise regular physical exercise at least 3 times a week 30 minutes each time as well as low cholesterol low fat diet. I think the liver enzymes are high because of the cholesterol causing fatty liver.            Please call in prescription simvastatin 20 mg tablet by mouth daily, 90 tablets with 3 refills; and gemfibrozil 600 mg tablet by mouth twice a day, 120 tablets with 3 refills ------

## 2014-05-18 NOTE — Telephone Encounter (Signed)
Pt given lab results with instructions to start taking prescribed Simvastatin 20 mg and Gemfibrozil 600 mg tab per doctor. Pt also instructed to exercise with low fat diet

## 2015-12-08 ENCOUNTER — Encounter: Payer: Self-pay | Admitting: Gastroenterology
